# Patient Record
Sex: Female | Born: 1965 | Race: White | Hispanic: No | Marital: Single | Smoking: Never smoker
Health system: Southern US, Community
[De-identification: ages and names within clinical notes are randomized; demographics above are authoritative.]

---

## 1999-06-09 ENCOUNTER — Other Ambulatory Visit: Admission: RE | Admit: 1999-06-09 | Discharge: 1999-06-09 | Payer: Self-pay | Admitting: Obstetrics and Gynecology

## 2000-06-22 ENCOUNTER — Other Ambulatory Visit: Admission: RE | Admit: 2000-06-22 | Discharge: 2000-06-22 | Payer: Self-pay | Admitting: Obstetrics and Gynecology

## 2001-08-01 ENCOUNTER — Other Ambulatory Visit: Admission: RE | Admit: 2001-08-01 | Discharge: 2001-08-01 | Payer: Self-pay | Admitting: Obstetrics and Gynecology

## 2002-04-02 ENCOUNTER — Emergency Department (HOSPITAL_COMMUNITY): Admission: EM | Admit: 2002-04-02 | Discharge: 2002-04-02 | Payer: Self-pay | Admitting: Emergency Medicine

## 2002-04-02 ENCOUNTER — Encounter: Payer: Self-pay | Admitting: *Deleted

## 2002-08-31 ENCOUNTER — Other Ambulatory Visit: Admission: RE | Admit: 2002-08-31 | Discharge: 2002-08-31 | Payer: Self-pay | Admitting: Obstetrics and Gynecology

## 2003-10-11 ENCOUNTER — Other Ambulatory Visit: Admission: RE | Admit: 2003-10-11 | Discharge: 2003-10-11 | Payer: Self-pay | Admitting: Obstetrics and Gynecology

## 2004-10-20 ENCOUNTER — Other Ambulatory Visit: Admission: RE | Admit: 2004-10-20 | Discharge: 2004-10-20 | Payer: Self-pay | Admitting: Obstetrics and Gynecology

## 2005-11-12 ENCOUNTER — Other Ambulatory Visit: Admission: RE | Admit: 2005-11-12 | Discharge: 2005-11-12 | Payer: Self-pay | Admitting: Obstetrics and Gynecology

## 2016-04-08 ENCOUNTER — Other Ambulatory Visit: Payer: Self-pay | Admitting: Obstetrics and Gynecology

## 2016-04-08 DIAGNOSIS — R928 Other abnormal and inconclusive findings on diagnostic imaging of breast: Secondary | ICD-10-CM

## 2016-04-09 ENCOUNTER — Other Ambulatory Visit: Payer: Self-pay | Admitting: Obstetrics and Gynecology

## 2016-04-21 ENCOUNTER — Ambulatory Visit
Admission: RE | Admit: 2016-04-21 | Discharge: 2016-04-21 | Disposition: A | Discharge status: Home / Self Care | Payer: BLUE CROSS/BLUE SHIELD | Source: Ambulatory Visit | Attending: Obstetrics and Gynecology | Admitting: Obstetrics and Gynecology

## 2016-04-21 DIAGNOSIS — R928 Other abnormal and inconclusive findings on diagnostic imaging of breast: Secondary | ICD-10-CM

## 2016-12-30 ENCOUNTER — Other Ambulatory Visit: Payer: Self-pay | Admitting: Obstetrics and Gynecology

## 2016-12-30 DIAGNOSIS — N6310 Unspecified lump in the right breast, unspecified quadrant: Secondary | ICD-10-CM

## 2017-01-01 ENCOUNTER — Ambulatory Visit
Admission: RE | Admit: 2017-01-01 | Discharge: 2017-01-01 | Disposition: A | Discharge status: Home / Self Care | Payer: BLUE CROSS/BLUE SHIELD | Source: Ambulatory Visit | Attending: Obstetrics and Gynecology | Admitting: Obstetrics and Gynecology

## 2017-01-01 ENCOUNTER — Other Ambulatory Visit: Payer: Self-pay | Admitting: Obstetrics and Gynecology

## 2017-01-01 DIAGNOSIS — N6310 Unspecified lump in the right breast, unspecified quadrant: Secondary | ICD-10-CM

## 2018-03-16 IMAGING — MG 2D DIGITAL DIAGNOSTIC UNILATERAL RIGHT MAMMOGRAM WITH CAD AND AD
6 series · 6 of 14 positions shown · non-contrast
Comparison: Previous exam(s).

CLINICAL DATA: Followup probably benign cluster of cysts in the
5:30 o'clock position of the right breast.

EXAM:
2D DIGITAL DIAGNOSTIC RIGHT MAMMOGRAM WITH CAD AND ADJUNCT TOMO
ULTRASOUND RIGHT BREAST

[R CC synth-2D]
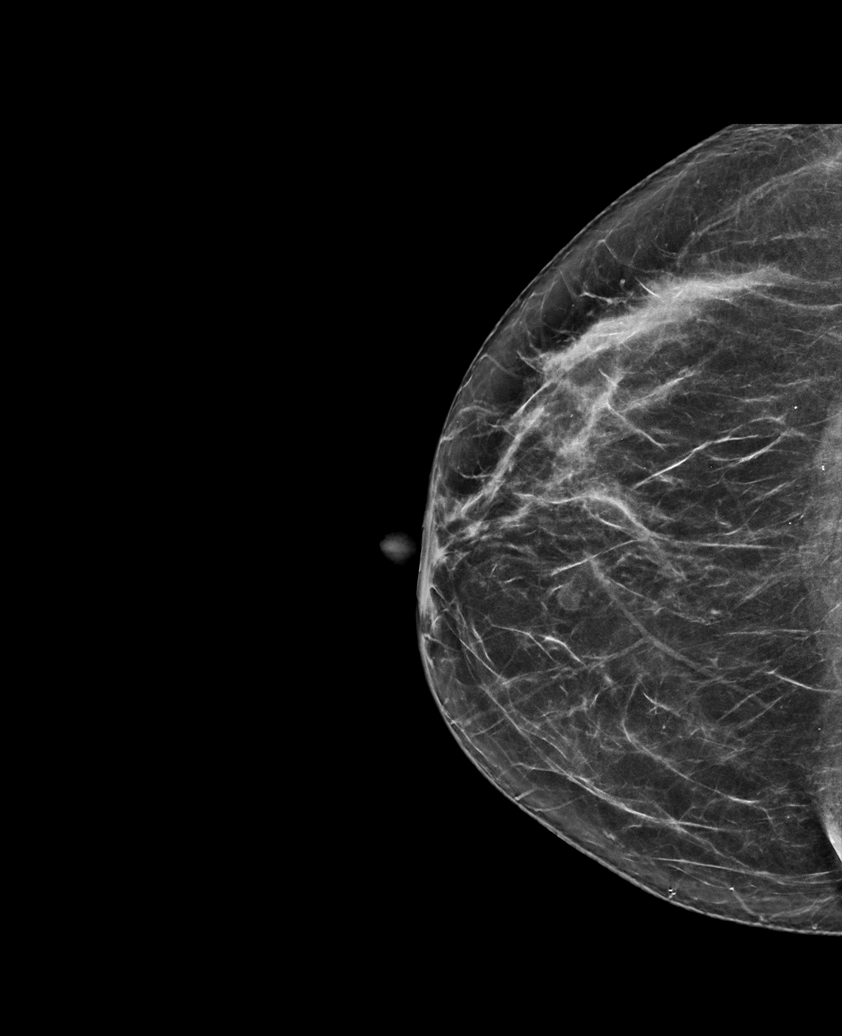

[R CC]
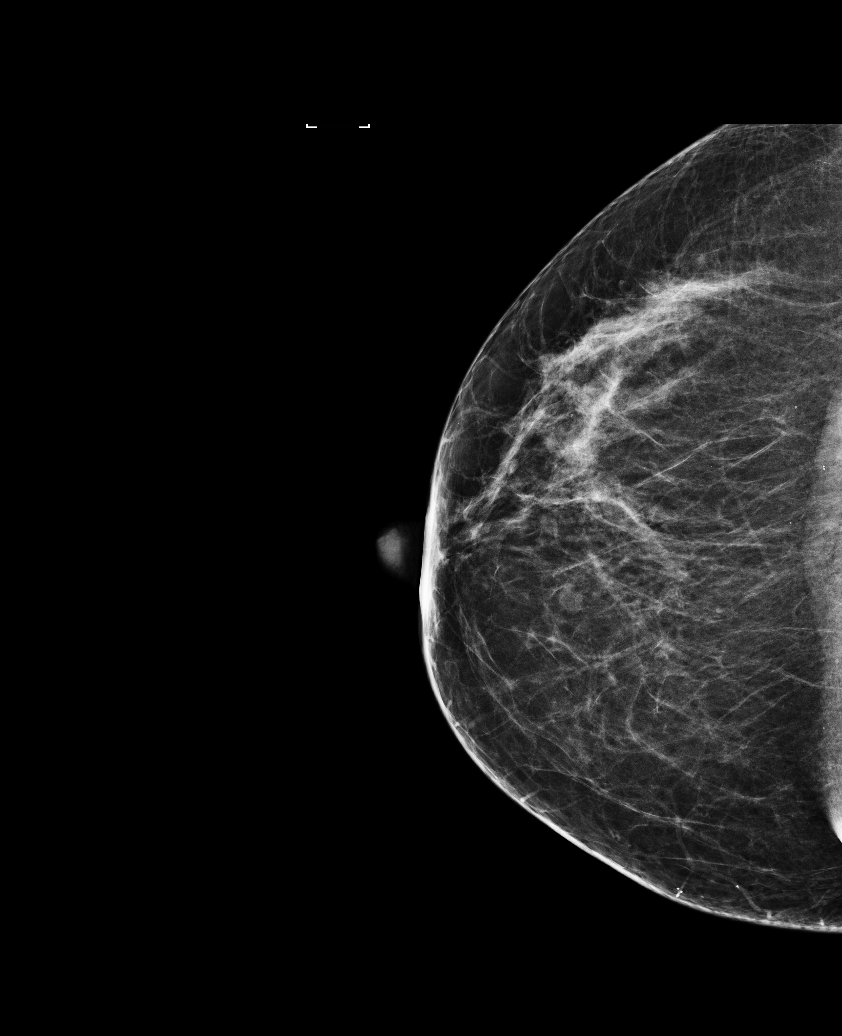

[R MLO]
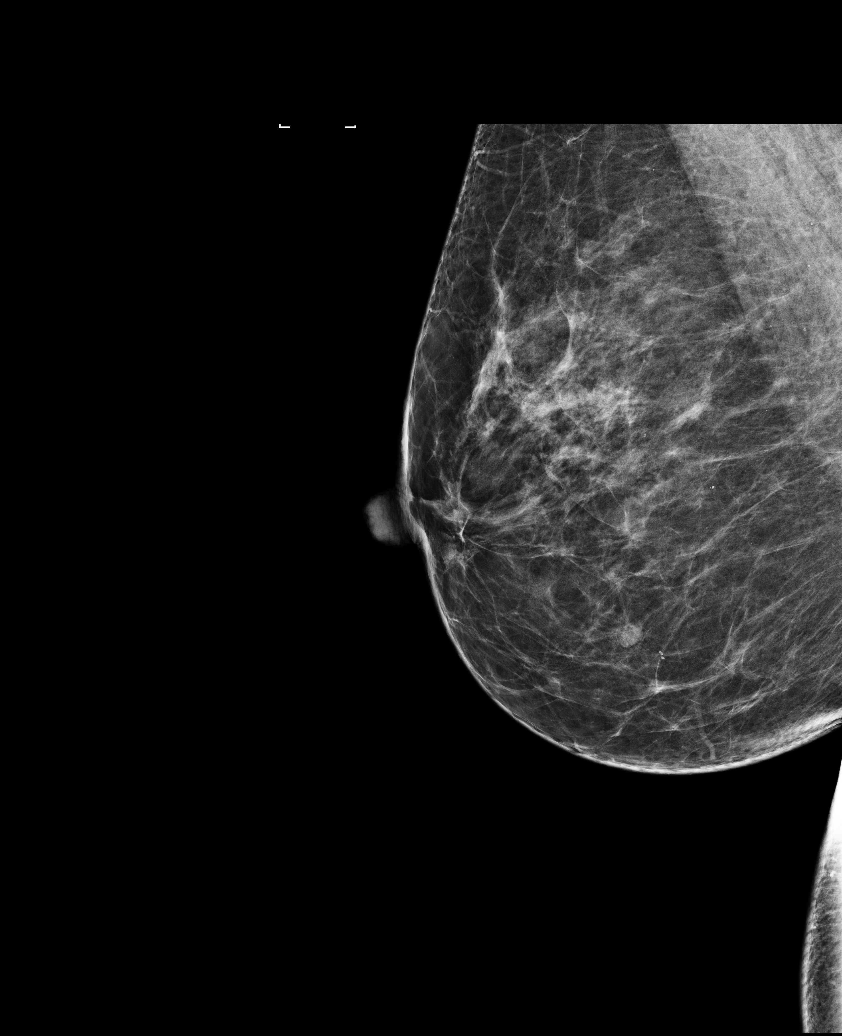

[R MLO synth-2D]
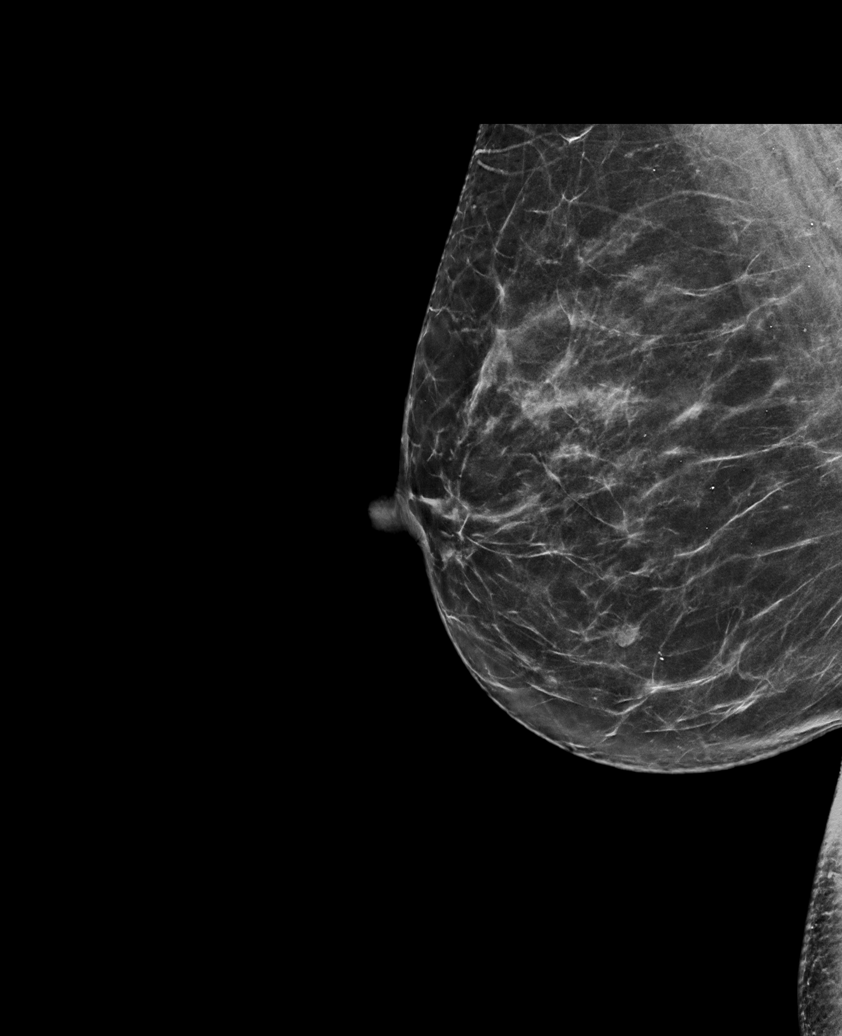

[R MLO tomo · tomo slice 39/76.0]
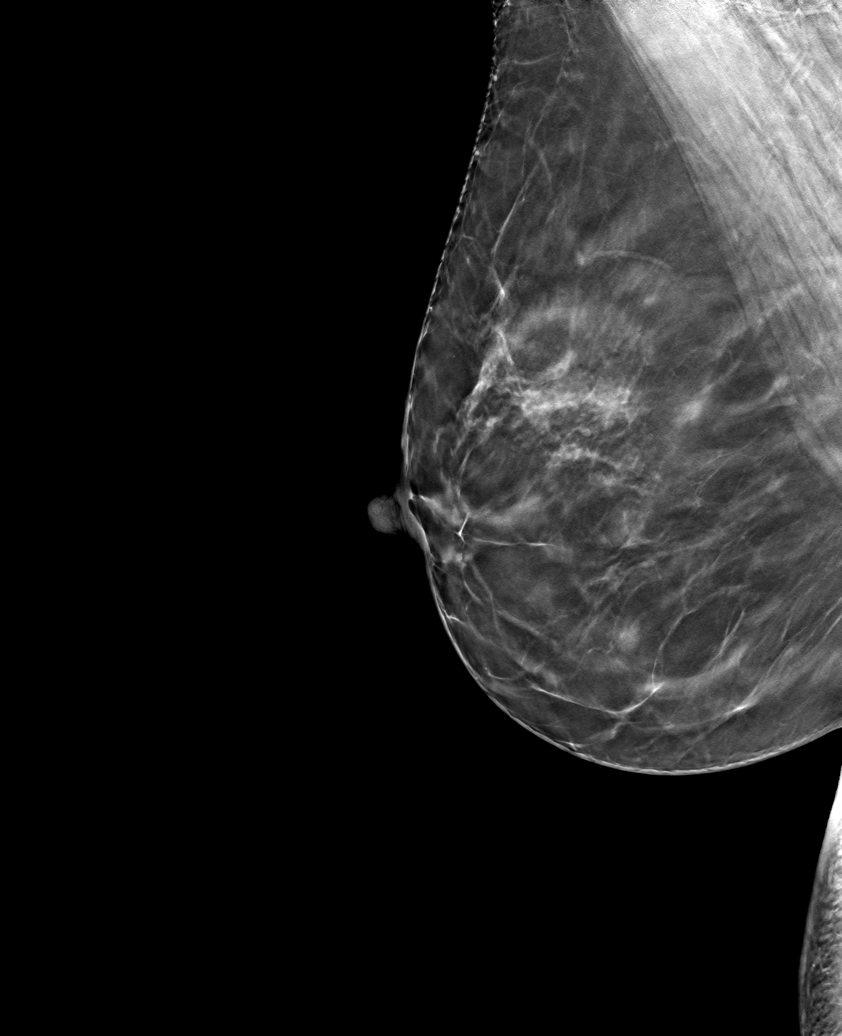

[R CC tomo · tomo slice 37/74.0]
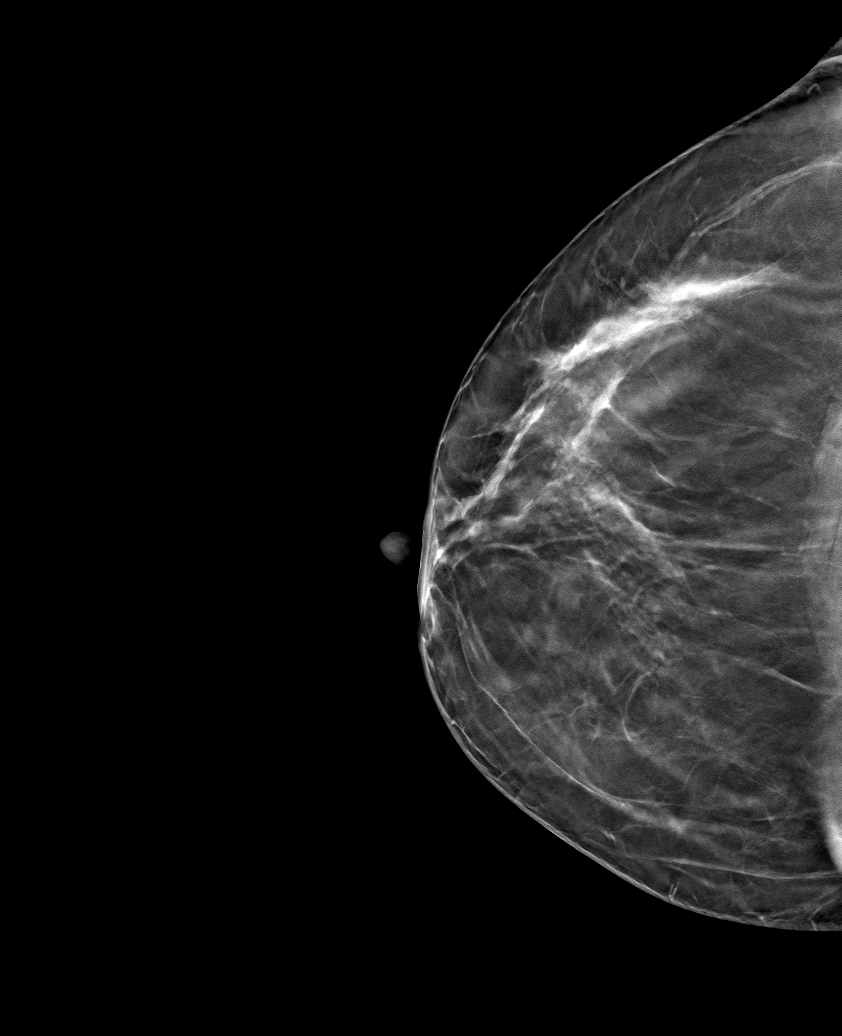

[6 of 14 positions shown; findings below may reference images not displayed]

ACR Breast Density Category c: The breast tissue is heterogeneously
dense, which may obscure small masses.
FINDINGS: 2D and 3D tomographic images of the right breast demonstrate no
significant change in a small, oval, circumscribed mass in the lower
inner quadrant.

Mammographic images were processed with CAD.

Targeted ultrasound is performed, showing a 7 x 6 x 3 mm cyst
containing low-level internal echoes and minimally thickened
internal septation in the 5:30 o'clock position of the right breast,
3 cm from the nipple. This measured 6 x 6 x 3 mm on 04/21/2016.
IMPRESSION: Stable benign right breast cyst.

RECOMMENDATION:
Bilateral screening mammogram in 3 months when due. That will be 1
year since mammographic evaluation of the left breast.

I have discussed the findings and recommendations with the patient.
Results were also provided in writing at the conclusion of the
visit. If applicable, a reminder letter will be sent to the patient
regarding the next appointment.

BI-RADS CATEGORY  2: Benign.

## 2018-03-16 IMAGING — US ULTRASOUND RIGHT BREAST LIMITED
1 series · 6 of 6 positions shown · non-contrast
Comparison: Previous exam(s).

CLINICAL DATA: Followup probably benign cluster of cysts in the
5:30 o'clock position of the right breast.

EXAM:
2D DIGITAL DIAGNOSTIC RIGHT MAMMOGRAM WITH CAD AND ADJUNCT TOMO
ULTRASOUND RIGHT BREAST

[Series 1: ultrasound right breast limited · 0.06mm/px · 6 of 6 slices shown]
[im 1/6]
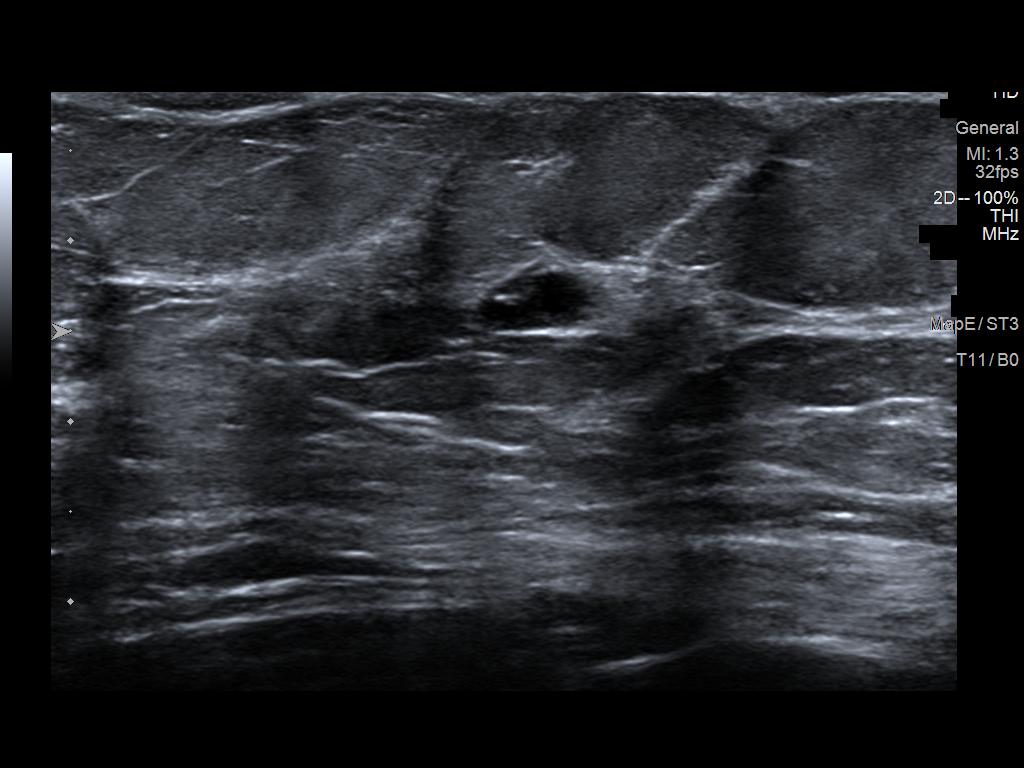
[im 2/6]
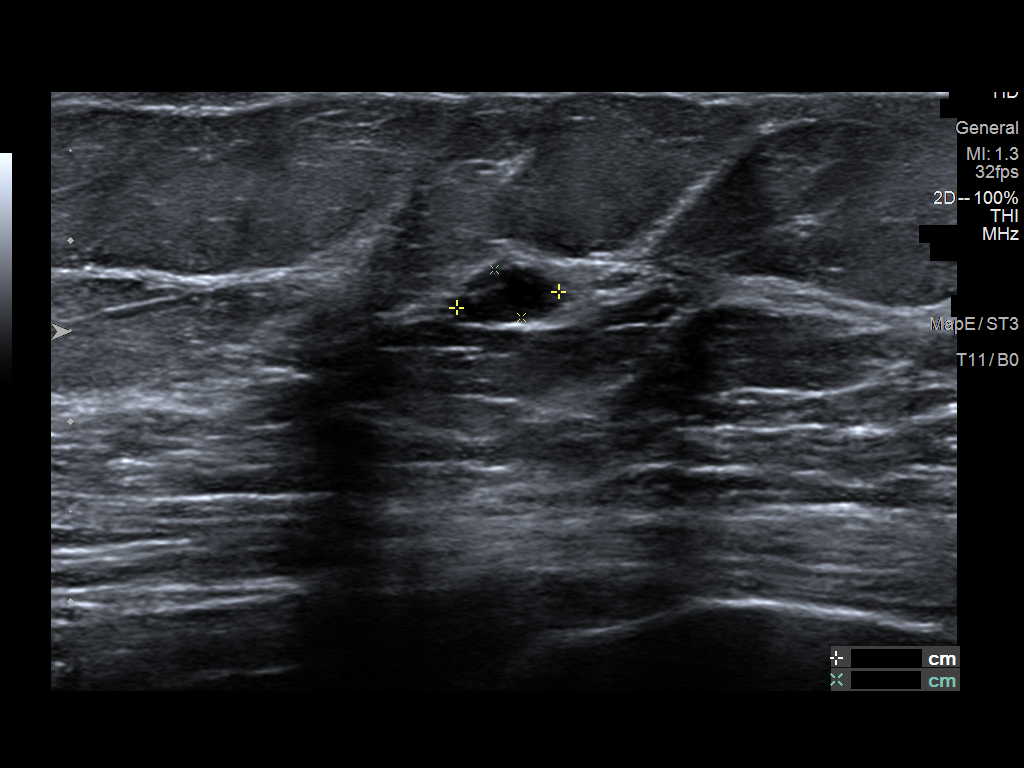
[im 3/6]
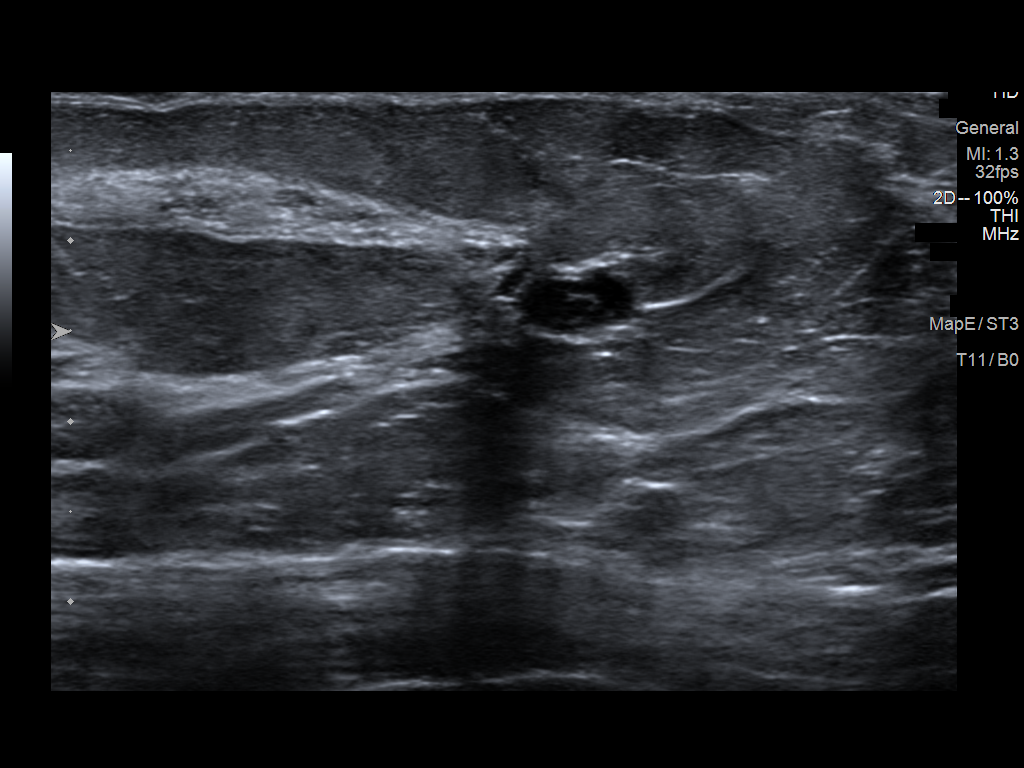
[im 4/6]
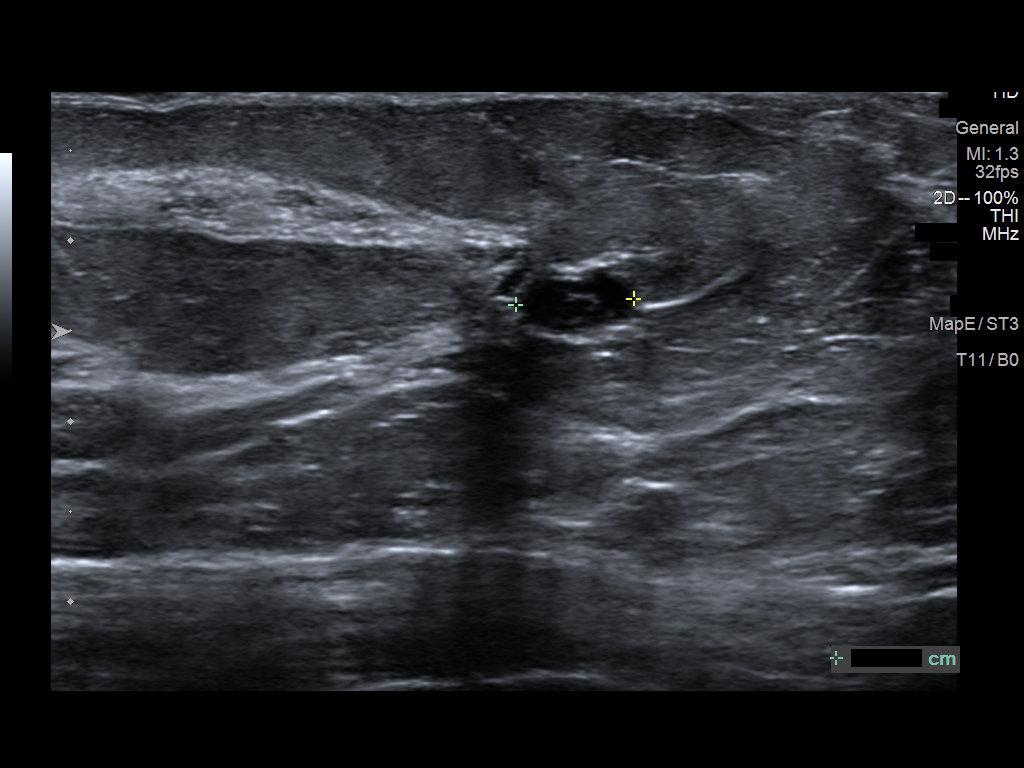
[im 5/6]
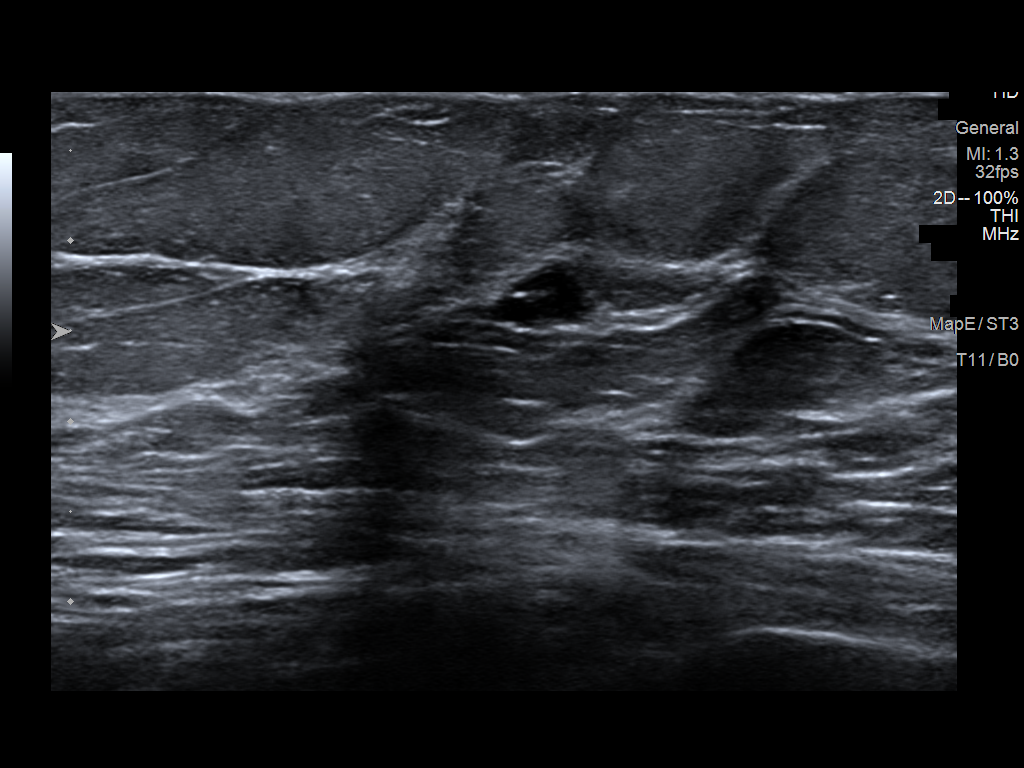
[im 6/6]
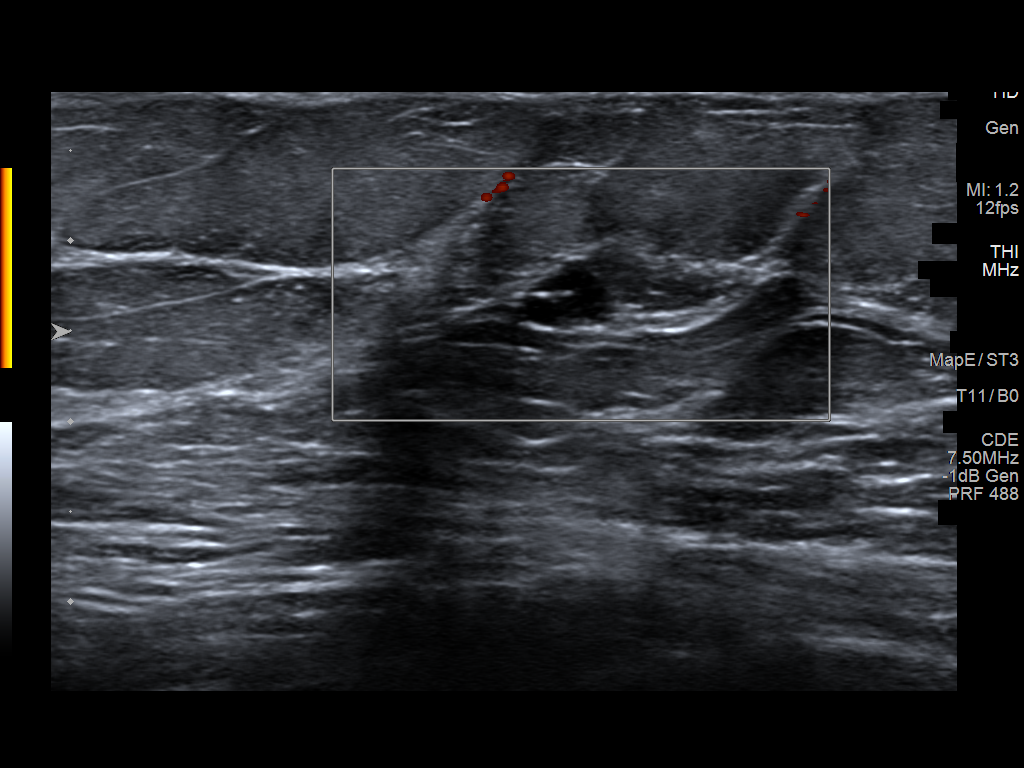

[6 of 6 positions shown; findings below may reference images not displayed]

ACR Breast Density Category c: The breast tissue is heterogeneously
dense, which may obscure small masses.
FINDINGS: 2D and 3D tomographic images of the right breast demonstrate no
significant change in a small, oval, circumscribed mass in the lower
inner quadrant.

Mammographic images were processed with CAD.

Targeted ultrasound is performed, showing a 7 x 6 x 3 mm cyst
containing low-level internal echoes and minimally thickened
internal septation in the 5:30 o'clock position of the right breast,
3 cm from the nipple. This measured 6 x 6 x 3 mm on 04/21/2016.
IMPRESSION: Stable benign right breast cyst.

RECOMMENDATION:
Bilateral screening mammogram in 3 months when due. That will be 1
year since mammographic evaluation of the left breast.

I have discussed the findings and recommendations with the patient.
Results were also provided in writing at the conclusion of the
visit. If applicable, a reminder letter will be sent to the patient
regarding the next appointment.

BI-RADS CATEGORY  2: Benign.

## 2018-06-13 ENCOUNTER — Other Ambulatory Visit: Payer: Self-pay | Admitting: Podiatry

## 2018-06-13 ENCOUNTER — Ambulatory Visit (INDEPENDENT_AMBULATORY_CARE_PROVIDER_SITE_OTHER): Payer: BLUE CROSS/BLUE SHIELD

## 2018-06-13 ENCOUNTER — Ambulatory Visit (INDEPENDENT_AMBULATORY_CARE_PROVIDER_SITE_OTHER): Payer: BLUE CROSS/BLUE SHIELD | Admitting: Podiatry

## 2018-06-13 ENCOUNTER — Encounter: Payer: Self-pay | Admitting: Podiatry

## 2018-06-13 VITALS — BP 124/78 | HR 75 | Resp 16

## 2018-06-13 DIAGNOSIS — M79671 Pain in right foot: Secondary | ICD-10-CM

## 2018-06-13 DIAGNOSIS — M779 Enthesopathy, unspecified: Secondary | ICD-10-CM

## 2018-06-13 DIAGNOSIS — M79672 Pain in left foot: Secondary | ICD-10-CM

## 2018-06-13 MED ORDER — TRIAMCINOLONE ACETONIDE 10 MG/ML IJ SUSP
10.0000 mg | Freq: Once | INTRAMUSCULAR | Status: AC
  Administered 2018-06-13: 10 mg

## 2018-06-13 NOTE — Progress Notes (Signed)
   Subjective:    Patient ID: Breanna CroonSarah F Minier, female    DOB: Mar 03, 1966, 52 y.o.   MRN: [id number]  HPI    Review of Systems  All other systems reviewed and are negative.      Objective:   Physical Exam        Assessment & Plan:

## 2018-06-15 NOTE — Progress Notes (Signed)
Subjective:   Patient ID: Breanna CroonSarah F Lorusso, female   DOB: 52 y.o.   MRN: [id number]   HPI Patient presents stating she is developed a lot of pain in her forefoot left over right and its been going on for at least 9 months.  Patient states that it hurts at all times but seems to be worse with certain types of shoes and tennis shoes is a little bit better and she cannot walk barefoot.  Patient does not smoke and likes to be active   Review of Systems  All other systems reviewed and are negative.       Objective:  Physical Exam  Constitutional: She appears well-developed and well-nourished.  Cardiovascular: Intact distal pulses.  Pulmonary/Chest: Effort normal.  Musculoskeletal: Normal range of motion.  Neurological: She is alert.  Skin: Skin is warm.  Nursing note and vitals reviewed.   Neurovascular status intact muscle strength is adequate range of motion within normal limits with patient noted to have exquisite discomfort inflammation around the second MPJ left with fluid buildup around the joint and pain with medial deviation of the second toe left over right.  Patient's right is mildly sore but nowhere near to the degree of the left patient was noted to have good digital perfusion and is well oriented x3     Assessment:  Acute capsulitis second MPJ left with probable flexor plate stretch or tear as part of the process with mild discomfort right     Plan:  H&P x-rays reviewed condition discussed.  I did explain that ultimately this may require surgical intervention but at this point I went ahead and I did a proximal nerve block left sterile prep applied and with sterile instrumentation I aspirated the second MPJ getting out a small amount of clear fluid and injected with quarter cc dexamethasone Kenalog and applied thick pad to reduce pressure on the joint surface.  Reappoint to recheck again in 2 weeks and may require orthotics and we will see the response to this and decide what would  be best for her long-term  X-ray taken indicated that there is some distention of the joint with deviation of the second digit left over right

## 2018-06-27 ENCOUNTER — Encounter: Payer: Self-pay | Admitting: Podiatry

## 2018-06-27 ENCOUNTER — Ambulatory Visit (INDEPENDENT_AMBULATORY_CARE_PROVIDER_SITE_OTHER): Payer: BLUE CROSS/BLUE SHIELD | Admitting: Podiatry

## 2018-06-27 DIAGNOSIS — M779 Enthesopathy, unspecified: Secondary | ICD-10-CM

## 2018-06-29 NOTE — Progress Notes (Signed)
Subjective:   Patient ID: Breanna Sheppard, female   DOB: 52 y.o.   MRN: [id number]   HPI Patient presents stating she is doing a lot better with minimal discomfort   ROS      Objective:  Physical Exam  Neurovascular status intact with discomfort left which is improving with minimal discomfort upon palpation     Assessment:  Improved plantar fasciitis left     Plan:  Advised on physical therapy supportive shoe gear usage and patient will be seen back on an as-needed basis
